# Patient Record
Sex: Male | Born: 1980 | Race: White | Hispanic: No | Marital: Married | Smoking: Never smoker
Health system: Southern US, Community
[De-identification: ages and names within clinical notes are randomized; demographics above are authoritative.]

---

## 2018-07-15 ENCOUNTER — Emergency Department (HOSPITAL_COMMUNITY)
Admission: EM | Admit: 2018-07-15 | Discharge: 2018-07-15 | Disposition: A | Discharge status: Home / Self Care | Payer: Self-pay | Attending: Emergency Medicine | Admitting: Emergency Medicine

## 2018-07-15 ENCOUNTER — Encounter (HOSPITAL_COMMUNITY): Payer: Self-pay | Admitting: Emergency Medicine

## 2018-07-15 ENCOUNTER — Other Ambulatory Visit: Payer: Self-pay

## 2018-07-15 ENCOUNTER — Emergency Department (HOSPITAL_COMMUNITY): Payer: Self-pay

## 2018-07-15 DIAGNOSIS — S46011A Strain of muscle(s) and tendon(s) of the rotator cuff of right shoulder, initial encounter: Secondary | ICD-10-CM | POA: Insufficient documentation

## 2018-07-15 DIAGNOSIS — M25511 Pain in right shoulder: Secondary | ICD-10-CM

## 2018-07-15 DIAGNOSIS — S43004A Unspecified dislocation of right shoulder joint, initial encounter: Secondary | ICD-10-CM | POA: Insufficient documentation

## 2018-07-15 DIAGNOSIS — Y999 Unspecified external cause status: Secondary | ICD-10-CM | POA: Insufficient documentation

## 2018-07-15 DIAGNOSIS — Y939 Activity, unspecified: Secondary | ICD-10-CM | POA: Insufficient documentation

## 2018-07-15 DIAGNOSIS — M75101 Unspecified rotator cuff tear or rupture of right shoulder, not specified as traumatic: Secondary | ICD-10-CM

## 2018-07-15 DIAGNOSIS — Y929 Unspecified place or not applicable: Secondary | ICD-10-CM | POA: Insufficient documentation

## 2018-07-15 MED ORDER — IOPAMIDOL (ISOVUE-M 200) INJECTION 41%
INTRAMUSCULAR | Status: AC
  Administered 2018-07-15: 20 mL via INTRASYNOVIAL
  Filled 2018-07-15: qty 10

## 2018-07-15 MED ORDER — LORAZEPAM 2 MG/ML IJ SOLN
0.5000 mg | Freq: Once | INTRAMUSCULAR | Status: AC
  Administered 2018-07-15: 14:00:00 0.5 mg via INTRAVENOUS
  Filled 2018-07-15: qty 1

## 2018-07-15 MED ORDER — OXYCODONE-ACETAMINOPHEN 5-325 MG PO TABS: 1.0000 | ORAL_TABLET | Freq: Four times a day (QID) | ORAL | 0 refills | Status: AC | PRN

## 2018-07-15 MED ORDER — FENTANYL CITRATE (PF) 100 MCG/2ML IJ SOLN
50.0000 ug | Freq: Once | INTRAMUSCULAR | Status: AC
  Administered 2018-07-15: 50 ug via INTRAVENOUS
  Filled 2018-07-15: qty 2

## 2018-07-15 MED ORDER — HYDROMORPHONE HCL 1 MG/ML IJ SOLN
2.0000 mg | Freq: Once | INTRAMUSCULAR | Status: AC
  Administered 2018-07-15: 16:00:00 2 mg via INTRAVENOUS
  Filled 2018-07-15: qty 2

## 2018-07-15 MED ORDER — LIDOCAINE HCL (PF) 1 % IJ SOLN
INTRAMUSCULAR | Status: AC
  Administered 2018-07-15: 5 mL via INTRASYNOVIAL
  Filled 2018-07-15: qty 5

## 2018-07-15 MED ORDER — PROPOFOL 10 MG/ML IV BOLUS
INTRAVENOUS | Status: AC | PRN
  Administered 2018-07-15: 20 mg via INTRAVENOUS
  Administered 2018-07-15: 40 mg via INTRAVENOUS
  Administered 2018-07-15: 20 mg via INTRAVENOUS
  Administered 2018-07-15: 40 mg via INTRAVENOUS

## 2018-07-15 MED ORDER — PROPOFOL 10 MG/ML IV BOLUS
0.5000 mg/kg | Freq: Once | INTRAVENOUS | Status: AC
  Administered 2018-07-15: 40.8 mg via INTRAVENOUS
  Filled 2018-07-15: qty 20

## 2018-07-15 MED ORDER — LORAZEPAM 2 MG/ML IJ SOLN
2.0000 mg | Freq: Once | INTRAMUSCULAR | Status: AC
  Administered 2018-07-15: 20:00:00 2 mg via INTRAVENOUS
  Filled 2018-07-15: qty 1

## 2018-07-15 MED ORDER — HYDROMORPHONE HCL 1 MG/ML IJ SOLN
2.0000 mg | Freq: Once | INTRAMUSCULAR | Status: AC
  Administered 2018-07-15: 15:00:00 2 mg via INTRAVENOUS
  Filled 2018-07-15: qty 2

## 2018-07-15 MED ORDER — DIAZEPAM 5 MG/ML IJ SOLN
10.0000 mg | Freq: Once | INTRAMUSCULAR | Status: AC
  Administered 2018-07-15: 14:00:00 10 mg via INTRAVENOUS
  Filled 2018-07-15: qty 2

## 2018-07-15 MED ORDER — CYCLOBENZAPRINE HCL 5 MG PO TABS: 5.0000 mg | ORAL_TABLET | Freq: Three times a day (TID) | ORAL | 0 refills | Status: AC | PRN

## 2018-07-15 MED ORDER — LORAZEPAM 2 MG/ML IJ SOLN
2.0000 mg | Freq: Once | INTRAMUSCULAR | Status: AC
  Administered 2018-07-15: 2 mg via INTRAVENOUS
  Filled 2018-07-15: qty 1

## 2018-07-15 MED ORDER — HYDROMORPHONE HCL 1 MG/ML IJ SOLN
2.0000 mg | Freq: Once | INTRAMUSCULAR | Status: AC
  Administered 2018-07-15: 2 mg via INTRAVENOUS
  Filled 2018-07-15: qty 2

## 2018-07-15 MED ORDER — FENTANYL CITRATE (PF) 100 MCG/2ML IJ SOLN
50.0000 ug | Freq: Once | INTRAMUSCULAR | Status: AC
  Administered 2018-07-15: 13:00:00 50 ug via INTRAVENOUS
  Filled 2018-07-15: qty 2

## 2018-07-15 MED ORDER — IOHEXOL 300 MG/ML  SOLN: 150.0000 mL | Freq: Once | INTRAMUSCULAR | Status: DC | PRN

## 2018-07-15 NOTE — ED Notes (Signed)
Patient transported to X-ray 

## 2018-07-15 NOTE — ED Notes (Signed)
Patient transported to MRI 

## 2018-07-15 NOTE — ED Notes (Signed)
EDP notified of unsuccessful MRI attempt. Patient to return to room.

## 2018-07-15 NOTE — ED Notes (Signed)
Called placed to MRI. Reports patient in scanner at this time.
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
## 2018-07-15 NOTE — Progress Notes (Signed)
Pt was attempted several times for MRI and could not control muscle spasms in his shoulder. There was a lot of motion of images and could not obtain a diagnostic study.  Pt was given medication several times and it did not help motion.
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
## 2018-07-15 NOTE — ED Notes (Signed)
Called received from MRI. Attempts to scan again unsuccessful. Patient arm continues to spasm causing pain. EDP Yao contacted at this time.

## 2018-07-15 NOTE — ED Notes (Signed)
IV team at bedside 

## 2018-07-15 NOTE — Progress Notes (Signed)
Orthopedic Tech Progress Note Patient Details:  Algin Osen 11-14-1980 [id number] DR did shoulder reduction on patient Ortho Devices Type of Ortho Device: Shoulder immobilizer Ortho Device/Splint Location: URE Ortho Device/Splint Interventions: Adjustment, Application, Ordered   Post Interventions Patient Tolerated: Well Instructions Provided: Care of device, Adjustment of device   Donald Pore 07/15/2018, 11:37 AM

## 2018-07-15 NOTE — ED Notes (Signed)
Surgeon at bedside.  

## 2018-07-15 NOTE — ED Notes (Signed)
Caryn Bee RN attempted IV stick in [facility], pt stated that he is usually a  "hard stick" & [place] is needed in his past with IVs.
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
## 2018-07-15 NOTE — ED Notes (Addendum)
Per verbal report from transferring RN patient is in MRI and continues to be in pain. RN reported providing pain medication in MRI. RN reports that she will contact EDP for pain management for patient if needed once they arrive to unit. Will round on patient once they return from MRI.

## 2018-07-15 NOTE — ED Notes (Signed)
ED Provider at bedside. 

## 2018-07-15 NOTE — ED Notes (Signed)
Patient transport to Xray and CT.

## 2018-07-15 NOTE — ED Notes (Signed)
Received call from MRI. Reported that pain medication was not effective and arm continued to spasm. EDP Ray called and provided verbal order. Hourly rounding and assessment completed at MRI. Pain medication provided. Vitals stable. Patient reports pain decreasing at this time. Arsenio Loader, MEd, RN [facility] 07/15/2018 3:39 PM

## 2018-07-15 NOTE — ED Notes (Signed)
Patient in CT with increased pain and spasms in Right shoulder preventing patient from getting CT scan; RN to administer pain meds in CT per EDP request-Monique,RN

## 2018-07-15 NOTE — ED Provider Notes (Signed)
.Sedation Date/Time: 07/15/2018 11:09 AM Performed by: Margarita Grizzle, MD Authorized by: Margarita Grizzle, MD   Consent:    Consent obtained:  Verbal   Consent given by:  Patient   Risks discussed:  Allergic reaction, dysrhythmia, inadequate sedation, nausea, prolonged hypoxia resulting in organ damage, prolonged sedation necessitating reversal, respiratory compromise necessitating ventilatory assistance and intubation and vomiting   Alternatives discussed:  Analgesia without sedation, anxiolysis and regional anesthesia Universal protocol:    Procedure explained and questions answered to patient or proxy's satisfaction: yes     Relevant documents present and verified: yes     Test results available and properly labeled: yes     Imaging studies available: yes     Required blood products, implants, devices, and special equipment available: yes     Site/side marked: yes     Immediately prior to procedure a time out was called: yes     Patient identity confirmation method:  Verbally with patient Indications:    Procedure necessitating sedation performed by:  Physician performing sedation Pre-sedation assessment:    Time since last food or drink:  4   ASA classification: class 1 - normal, healthy patient     Neck mobility: normal     Mouth opening:  3 or more finger widths   Thyromental distance:  4 finger widths   Mallampati score:  I - soft palate, uvula, fauces, pillars visible   Pre-sedation assessments completed and reviewed: airway patency, cardiovascular function, hydration status, mental status, nausea/vomiting, pain level, respiratory function and temperature     Pre-sedation assessment completed:  07/15/2018 10:45 AM Immediate pre-procedure details:    Reassessment: Patient reassessed immediately prior to procedure     Reviewed: vital signs, relevant labs/tests and NPO status     Verified: bag valve mask available, emergency equipment available, intubation equipment available, IV  patency confirmed, oxygen available and suction available   Procedure details (see MAR for exact dosages):    Preoxygenation:  Nasal cannula   Sedation:  Propofol   Intra-procedure monitoring:  Blood pressure monitoring, cardiac monitor, continuous pulse oximetry, frequent LOC assessments, frequent vital sign checks and continuous capnometry   Intra-procedure events: none     Total Provider sedation time (minutes):  15 Post-procedure details:    Post-sedation assessment completed:  07/15/2018 11:10 AM   Attendance: Constant attendance by certified staff until patient recovered     Recovery: Patient returned to pre-procedure baseline     Post-sedation assessments completed and reviewed: airway patency, cardiovascular function, hydration status, mental status, nausea/vomiting, pain level, respiratory function and temperature     Patient is stable for discharge or admission: yes     Patient tolerance:  Tolerated well, no immediate complications Reduction of dislocation Date/Time: 07/15/2018 11:10 AM Performed by: Margarita Grizzle, MD Authorized by: Margarita Grizzle, MD  Consent: Verbal consent obtained. Risks and benefits: risks, benefits and alternatives were discussed Consent given by: patient Patient understanding: patient states understanding of the procedure being performed Patient consent: the patient's understanding of the procedure matches consent given Imaging studies: imaging studies available Patient identity confirmed: verbally with patient and arm band Time out: Immediately prior to procedure a "time out" was called to verify the correct patient, procedure, equipment, support staff and site/side marked as required. Local anesthesia used: no  Anesthesia: Local anesthesia used: no  Sedation: Patient sedated: yes Sedation type: moderate (conscious) sedation Sedatives: propofol Sedation start date/time: 07/15/2018 10:45 AM Sedation end date/time: 07/15/2018 11:11 AM Vitals: Vital  signs were monitored  during sedation.  Patient tolerance: Patient tolerated the procedure well with no immediate complications   During sedation/reduction shoulder appears normally placed, but patient appears to have muscle spasm and shoulder subluxes when let go     Margarita Grizzleay, Mykaela Arena, MD 07/15/18 1637
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
## 2018-07-15 NOTE — ED Provider Notes (Signed)
  Physical Exam  BP (!) 142/101 (BP Location: Left Arm)   Pulse (!) 103   Temp 98 F (36.7 C) (Oral)   Resp 18   Ht 5\' 10"  (1.778 m)   Wt 81.6 kg   SpO2 98%   BMI 25.83 kg/m   Physical Exam  ED Course/Procedures     Procedures  MDM  Care assumed at 3 PM.  Patient had recent shoulder surgery and appears to have a shoulder dislocation.  Attempt at reduction was performed by Dr. Rosalia Hammers but there to be a persistent dislocation.  Signout pending MRI of the shoulder.  5 pm Patient unable to tolerate MRI despite multiple attempts and Ativan and Dilaudid.  I talked to Dr. August Saucer from Ortho.  Recommend CT arthrogram.  8:53 PM CT arthrogram performed and showed no dislocation. There is full thickness supraspinatus tear. Dr. August Saucer saw patient in the ED. He recommend shoulder immobilizer, percocet and muscle relaxants.  Patient can follow-up in the office.     Charlynne Pander, MD 07/15/18 [phone]

## 2018-07-16 ENCOUNTER — Telehealth: Payer: Self-pay | Admitting: Orthopedic Surgery

## 2018-07-16 ENCOUNTER — Telehealth: Payer: Self-pay

## 2018-07-16 NOTE — Telephone Encounter (Signed)
I tried calling patient. No answer. VM is full. Unable to LM. Can discuss per Dr August Saucer should patient return call.

## 2018-07-16 NOTE — Telephone Encounter (Signed)
Yes  we really have nothing stronger - pls add toradol 10 po tid x 5 dayys thx no other nsaids with that

## 2018-07-16 NOTE — Telephone Encounter (Signed)
Patient called back. Discussed with him per Dr August Saucer. I asked what pharmacy he wanted submitted to, he said he was unsure because he was not familiar with pharmacies around here but would do some research and call us back with pharmacy.

## 2018-07-16 NOTE — Telephone Encounter (Signed)
Patient called advised the medication (Oxycodone 5 mg) is not helping with the pain in his right shoulder. Patient asked for a call back as soon as possible. The number to contact patient is [phone]

## 2018-07-16 NOTE — Telephone Encounter (Signed)
Tried calling again. No answer. VM is full unable to LM

## 2018-07-16 NOTE — Telephone Encounter (Signed)
Please advise. Did you see patient in ER?

## 2018-07-16 NOTE — Telephone Encounter (Signed)
Patient called in regards to medication. Stated someone called. Requesting a call back.  Please call patient to advise [phone]

## 2020-04-20 IMAGING — DX PORTABLE RIGHT SHOULDER
1 series · 1 of 1 positions shown · non-contrast
Comparison: 07/15/2018

CLINICAL DATA: Post reduction

EXAM:
PORTABLE RIGHT SHOULDER

[shoulder]
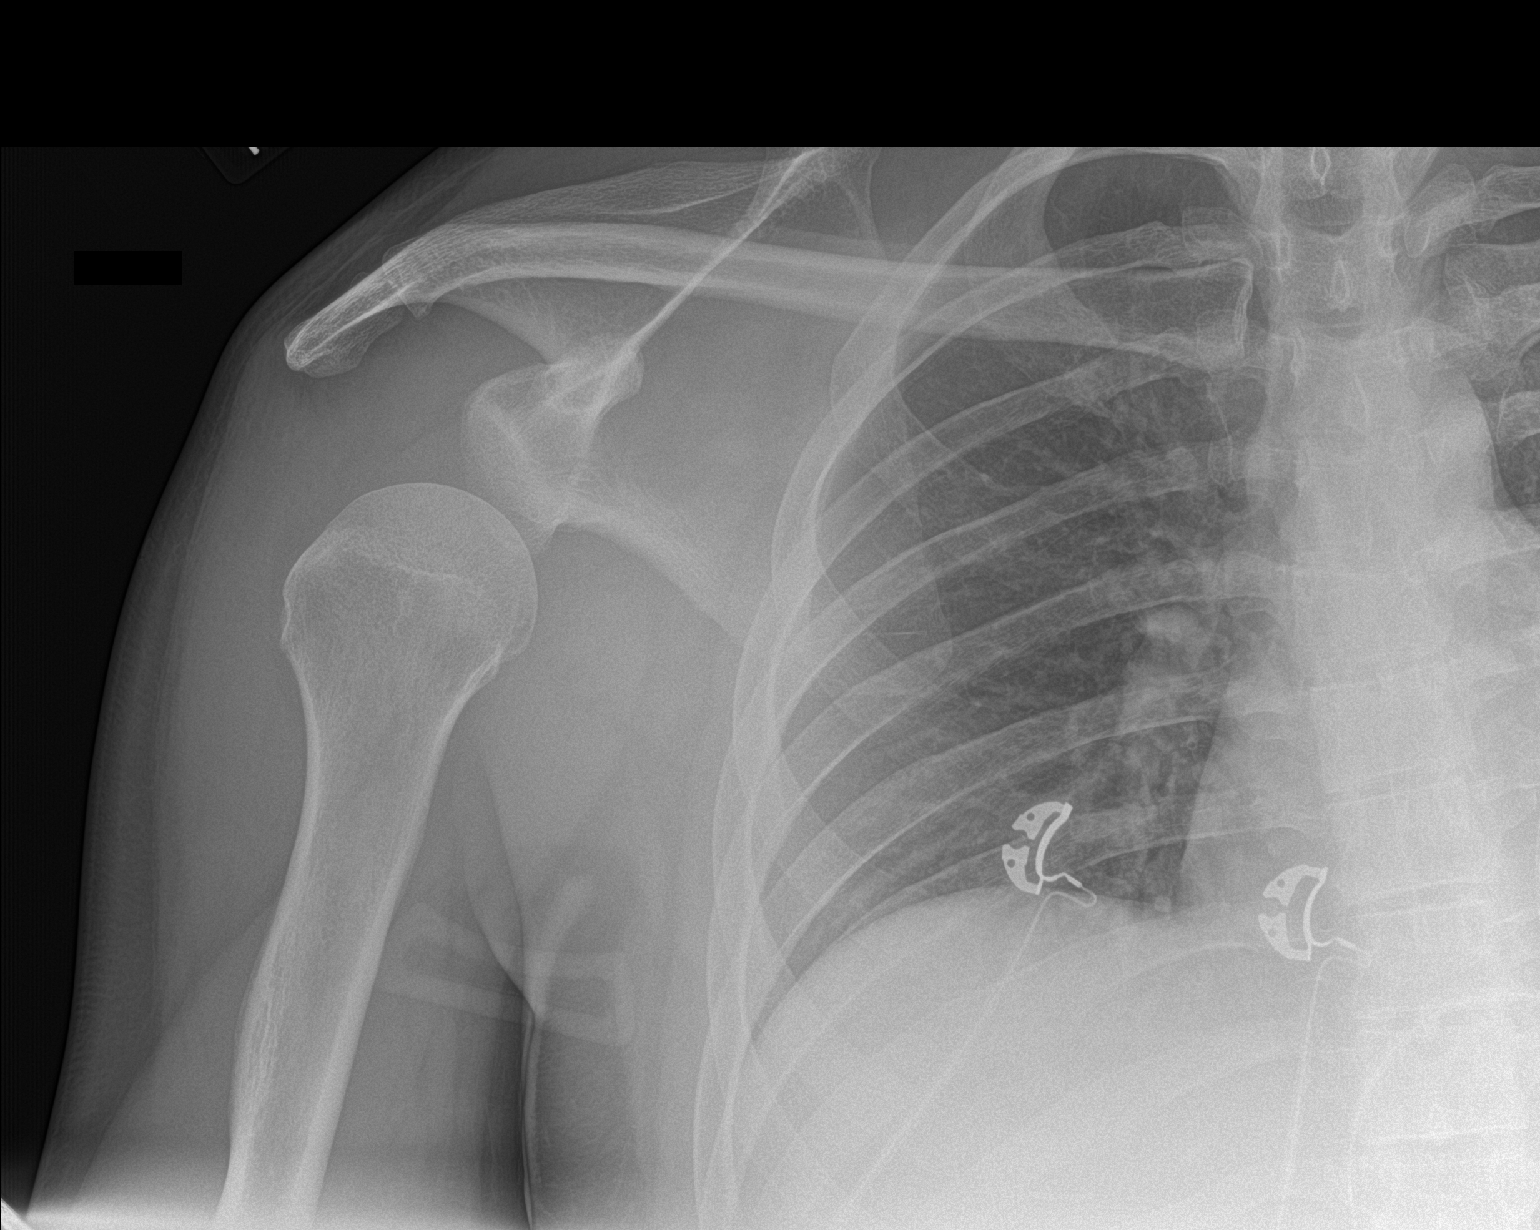

[1 of 1 positions shown; findings below may reference images not displayed]

FINDINGS: Persistent anterior subluxation of the shoulder. Single view only.
No fracture identified.
IMPRESSION: Persistent anterior shoulder subluxation.
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
